# Patient Record
Sex: Female | Born: 1990 | Race: White | Hispanic: No | Marital: Single | Smoking: Never smoker
Health system: Southern US, Community
[De-identification: ages and names within clinical notes are randomized; demographics above are authoritative.]

## PROBLEM LIST (undated history)

## (undated) DIAGNOSIS — R51 Headache: Secondary | ICD-10-CM

## (undated) DIAGNOSIS — R002 Palpitations: Secondary | ICD-10-CM

## (undated) DIAGNOSIS — F419 Anxiety disorder, unspecified: Secondary | ICD-10-CM

## (undated) DIAGNOSIS — K529 Noninfective gastroenteritis and colitis, unspecified: Secondary | ICD-10-CM

## (undated) HISTORY — PX: WISDOM TOOTH EXTRACTION: SHX21

## (undated) HISTORY — PX: TONSILLECTOMY: SUR1361

## (undated) HISTORY — DX: Noninfective gastroenteritis and colitis, unspecified: K52.9

---
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
## 2010-05-09 HISTORY — PX: COLONOSCOPY: SHX174

## 2010-11-02 ENCOUNTER — Ambulatory Visit (INDEPENDENT_AMBULATORY_CARE_PROVIDER_SITE_OTHER): Payer: BC Managed Care – PPO | Admitting: Internal Medicine

## 2010-11-02 DIAGNOSIS — K5190 Ulcerative colitis, unspecified, without complications: Secondary | ICD-10-CM

## 2010-11-03 ENCOUNTER — Other Ambulatory Visit (INDEPENDENT_AMBULATORY_CARE_PROVIDER_SITE_OTHER): Payer: Self-pay | Admitting: Internal Medicine

## 2010-11-03 DIAGNOSIS — K529 Noninfective gastroenteritis and colitis, unspecified: Secondary | ICD-10-CM

## 2010-11-03 DIAGNOSIS — R1031 Right lower quadrant pain: Secondary | ICD-10-CM

## 2010-11-05 ENCOUNTER — Ambulatory Visit (HOSPITAL_COMMUNITY)
Admission: RE | Admit: 2010-11-05 | Discharge: 2010-11-05 | Disposition: A | Discharge status: Home / Self Care | Payer: BC Managed Care – PPO | Source: Ambulatory Visit | Attending: Internal Medicine | Admitting: Internal Medicine

## 2010-11-05 DIAGNOSIS — K529 Noninfective gastroenteritis and colitis, unspecified: Secondary | ICD-10-CM

## 2010-11-05 DIAGNOSIS — R1031 Right lower quadrant pain: Secondary | ICD-10-CM | POA: Insufficient documentation

## 2010-11-05 DIAGNOSIS — Z8719 Personal history of other diseases of the digestive system: Secondary | ICD-10-CM | POA: Insufficient documentation

## 2010-11-05 MED ORDER — IOHEXOL 300 MG/ML  SOLN
100.0000 mL | Freq: Once | INTRAMUSCULAR | Status: AC | PRN
  Administered 2010-11-05: 100 mL via INTRAVENOUS

## 2011-04-11 ENCOUNTER — Other Ambulatory Visit (INDEPENDENT_AMBULATORY_CARE_PROVIDER_SITE_OTHER): Payer: Self-pay | Admitting: *Deleted

## 2011-04-11 ENCOUNTER — Encounter (INDEPENDENT_AMBULATORY_CARE_PROVIDER_SITE_OTHER): Payer: Self-pay | Admitting: *Deleted

## 2011-04-11 ENCOUNTER — Ambulatory Visit (INDEPENDENT_AMBULATORY_CARE_PROVIDER_SITE_OTHER): Payer: BC Managed Care – PPO | Admitting: Internal Medicine

## 2011-04-11 ENCOUNTER — Encounter (INDEPENDENT_AMBULATORY_CARE_PROVIDER_SITE_OTHER): Payer: Self-pay | Admitting: Internal Medicine

## 2011-04-11 VITALS — BP 92/48 | HR 76 | Temp 99.1°F | Ht 65.0 in | Wt 128.1 lb

## 2011-04-11 DIAGNOSIS — K529 Noninfective gastroenteritis and colitis, unspecified: Secondary | ICD-10-CM

## 2011-04-11 DIAGNOSIS — K5289 Other specified noninfective gastroenteritis and colitis: Secondary | ICD-10-CM

## 2011-04-11 NOTE — Progress Notes (Signed)
Subjective:     Patient ID: Briana Trujillo, female   DOB: 18-Jun-1990, 20 y.o.   MRN: [id number]  HPI  Briana Trujillo is a 20 yr old white female here today for f/u.   She was last seen in out office in June of this year. She had recently been admitted to [facility] for abdominal pain.  She underwent a CT wcan which revealed mild wall thickening of the cecum with surrounding inflammatory changes. The appendix was slightly prominent and mucosa was hyper-enhancing.  Felt the findings were related to primary colitis. (Infectious or inflammatory) Repeat CT 11/05/2010 revealed an unremarkable CT abdomen/pelvis She presents today with c/o heart burn a couple of times a week.  The says she has abdominal pain which occurs about 3 times a week.  She will have it and then have to go to the bathroom to have a BM.  She is alternating between watery stools to normal stools. She will have watery stools about once a week. She usually has 2 stools a day.   No melena or bright red rectal bleeding. She says she had a stomach virus last week and lost 8 pounds. She has actually gained about 2 lbs since her last visit. She says these are the same symptoms she had when she was in the hospital but not as extreme.  Her mo her also say stress sets her symptoms off. No family hx of colon cancer or Crohn's Review of Systems  See hpi Current Outpatient Prescriptions  Medication Sig Dispense Refill  . ALPRAZolam (XANAX) 0.5 MG tablet Take 0.5 mg by mouth as needed.        Marland Kitchen amitriptyline (ELAVIL) 25 MG tablet Take 25 mg by mouth at bedtime.        . medroxyPROGESTERone (DEPO-PROVERA) 150 MG/ML injection Inject 150 mg into the muscle every 3 (three) months.        . naproxen sodium (ANAPROX) 220 MG tablet Take 220 mg by mouth as needed.         Past Surgical History  Procedure Date  . Tonisillectomy    Past Medical History  Diagnosis Date  . Colitis    History   Social History  . Marital Status: Single    Spouse Name: N/A    Number  of Children: N/A  . Years of Education: N/A   Occupational History  . Not on file.   Social History Main Topics  . Smoking status: Never Smoker   . Smokeless tobacco: Not on file  . Alcohol Use: Yes     couple of times a week  . Drug Use: No  . Sexually Active: Not on file   Other Topics Concern  . Not on file   Social History Narrative  . No narrative on file   Family Status  Relation Status Death Age  . Mother Alive     good health  . Father Alive     good health  . Brother Alive     good health   Allergies  Allergen Reactions  . Codeine         Objective:   Physical Exam Filed Vitals:   04/11/11 1053  BP: 92/48  Pulse: 76  Temp: 99.1 F (37.3 C)  Height: 5\' 5"  (1.651 m)  Weight: 128 lb 1.6 oz (58.106 kg)    Alert and oriented. Skin warm and dry. Oral mucosa is moist. Natural teeth in good condition. Sclera anicteric, conjunctivae is pink. Thyroid not enlarged. No cervical lymphadenopathy.  Lungs clear. Heart regular rate and rhythm.  Abdomen is soft. Bowel sounds are positive. No hepatomegaly. No abdominal masses felt. No tenderness.  No edema to lower extremities. Patient is alert and oriented.      Assessment:    Abdominal pain of ? Etiology.  Hx of colitis.   Inflammtory process needs to be ruled out such as Crohns.    Plan:    colonoscopy in the near future with Dr. Karilyn Cota.     The risks and benefits such as perforation, bleeding, and infection were reviewed with the patient and is agreeable.  Patient is satisified were her care.

## 2011-04-11 NOTE — Patient Instructions (Signed)
Will schedule a colonoscopy with Dr. Rehman 

## 2011-04-12 ENCOUNTER — Telehealth (INDEPENDENT_AMBULATORY_CARE_PROVIDER_SITE_OTHER): Payer: Self-pay | Admitting: *Deleted

## 2011-04-12 NOTE — Telephone Encounter (Signed)
Wasn't told when to start the clear liquid diet. Please return the call to [phone].

## 2011-04-12 NOTE — Telephone Encounter (Signed)
Spoke to patient, instructions given again

## 2011-04-13 ENCOUNTER — Encounter (HOSPITAL_COMMUNITY): Payer: Self-pay | Admitting: Pharmacy Technician

## 2011-04-13 MED ORDER — SODIUM CHLORIDE 0.45 % IV SOLN
Freq: Once | INTRAVENOUS | Status: AC
  Administered 2011-04-14: 07:00:00 via INTRAVENOUS

## 2011-04-14 ENCOUNTER — Encounter (HOSPITAL_COMMUNITY)
Admission: RE | Disposition: A | Discharge status: Home / Self Care | Payer: Self-pay | Source: Ambulatory Visit | Attending: Internal Medicine

## 2011-04-14 ENCOUNTER — Ambulatory Visit (HOSPITAL_COMMUNITY)
Admission: RE | Admit: 2011-04-14 | Discharge: 2011-04-14 | Disposition: A | Discharge status: Home / Self Care | Payer: BC Managed Care – PPO | Source: Ambulatory Visit | Attending: Internal Medicine | Admitting: Internal Medicine

## 2011-04-14 ENCOUNTER — Other Ambulatory Visit (INDEPENDENT_AMBULATORY_CARE_PROVIDER_SITE_OTHER): Payer: Self-pay | Admitting: Internal Medicine

## 2011-04-14 ENCOUNTER — Encounter (HOSPITAL_COMMUNITY): Payer: Self-pay | Admitting: *Deleted

## 2011-04-14 DIAGNOSIS — R109 Unspecified abdominal pain: Secondary | ICD-10-CM | POA: Insufficient documentation

## 2011-04-14 DIAGNOSIS — K529 Noninfective gastroenteritis and colitis, unspecified: Secondary | ICD-10-CM

## 2011-04-14 DIAGNOSIS — Z8719 Personal history of other diseases of the digestive system: Secondary | ICD-10-CM | POA: Insufficient documentation

## 2011-04-14 DIAGNOSIS — R197 Diarrhea, unspecified: Secondary | ICD-10-CM | POA: Insufficient documentation

## 2011-04-14 DIAGNOSIS — K6389 Other specified diseases of intestine: Secondary | ICD-10-CM

## 2011-04-14 HISTORY — DX: Headache: R51

## 2011-04-14 HISTORY — DX: Palpitations: R00.2

## 2011-04-14 HISTORY — DX: Anxiety disorder, unspecified: F41.9

## 2011-04-14 HISTORY — PX: COLONOSCOPY: SHX5424

## 2011-04-14 SURGERY — COLONOSCOPY
Anesthesia: Moderate Sedation

## 2011-04-14 MED ORDER — MIDAZOLAM HCL 5 MG/5ML IJ SOLN
INTRAMUSCULAR | Status: AC
  Filled 2011-04-14: qty 10

## 2011-04-14 MED ORDER — MEPERIDINE HCL 50 MG/ML IJ SOLN
INTRAMUSCULAR | Status: AC
  Filled 2011-04-14: qty 1

## 2011-04-14 MED ORDER — DICYCLOMINE HCL 10 MG PO CAPS: 10.0000 mg | ORAL_CAPSULE | Freq: Three times a day (TID) | ORAL | Status: DC

## 2011-04-14 MED ORDER — MEPERIDINE HCL 50 MG/ML IJ SOLN
INTRAMUSCULAR | Status: DC | PRN
  Administered 2011-04-14 (×2): 25 mg via INTRAVENOUS

## 2011-04-14 MED ORDER — MIDAZOLAM HCL 5 MG/5ML IJ SOLN
INTRAMUSCULAR | Status: DC | PRN
  Administered 2011-04-14 (×5): 2 mg via INTRAVENOUS

## 2011-04-14 MED ORDER — STERILE WATER FOR IRRIGATION IR SOLN
Status: DC | PRN
  Administered 2011-04-14: 08:00:00

## 2011-04-14 NOTE — Op Note (Signed)
COLONOSCOPY PROCEDURE REPORT  PATIENT:  Briana Trujillo  MR#:  [id number] Birthdate:  May 29, 1990, 20 y.o., female Endoscopist:  Dr. Malissa Hippo, MD Referred By:  Dr. Ignatius Specking, MD  Procedure Date: 04/14/2011  Procedure:   Colonoscopy with ileoscopy.  Indications: Patient is 20 year old Caucasian female with intermittent diarrhea and lower abdominal pain for the last 6 months. When her symptoms began back in June 2012 CT  revealed cecal wall thickening/inflammatory changes. Followup CT revealed resolution of these changes her symptoms have persisted. She is therefore undergoing diagnostic exam.  Informed Consent: Procedure and risks were reviewed with the patient and informed consent was obtained.  Medications:  Demerol 50 mg IV Versed 10 mg IV  Description of procedure:  After a digital rectal exam was performed, that colonoscope was advanced from the anus through the rectum and colon to the area of the cecum, ileocecal valve and appendiceal orifice. The cecum was deeply intubated. These structures were well-seen and photographed for the record. From the level of the cecum and ileocecal valve, the scope was slowly and cautiously withdrawn. The mucosal surfaces were carefully surveyed utilizing scope tip to flexion to facilitate fold flattening as needed. The scope was pulled down into the rectum where a thorough exam including retroflexion was performed. TI was also examined.  Findings:   Prep satisfactory. Mucosa of from distal most segment of terminal ileum revealed nodularity and was somewhat pale. Biopsy taken for routine histology. Fine nodularity noted to cecal mucosa possibly indicated above lymphoid hyperplasia. There were no erosions or ulcers. Biopsy was also taken from this area. Mucosa of rest of the colon was normal. Rectal mucosa similarly was normal. Unremarkable anorectal junction.  Therapeutic/Diagnostic Maneuvers Performed:  See above. Stool sample was also  obtained for cultures and O&P.  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 Biopsy taken from this area. Cecal mucosal nodularity most likely due to lymphoid hyperplasia. Once again biopsy taken from this area. No changes noted to suggest inflammatory bowel disease. Stool sample sent to the lab for cultures and O&P.  Recommendations:  Dicyclomine 10 mg by mouth 3 times a day. I will be contacting patient with results of biopsy and further recommendations  Rifka Ramey U  04/14/2011 8:14 AM  CC: Dr. Ignatius Specking., MD, MD & Dr. Bonnetta Barry ref. provider found

## 2011-04-14 NOTE — H&P (Signed)
This is an update to history and physical from 04/11/2011. There's been no change in patient's condition or medications. She is here to undergo diagnostic colonoscopy to rule out inflammatory bowel disease. Is been experiencing pain across her lower abdomen and intermittent diarrhea diarrhea. Her symptoms began in June of this year. Abdominopelvic CT revealed thickening to cecum followup CT revealed resolution of these changes and he felt she most likely had an infection. However her symptoms have lingered on.

## 2011-04-15 LAB — OVA AND PARASITE EXAMINATION: Ova and parasites: NONE SEEN

## 2011-04-18 LAB — STOOL CULTURE

## 2011-04-19 ENCOUNTER — Encounter (INDEPENDENT_AMBULATORY_CARE_PROVIDER_SITE_OTHER): Payer: Self-pay | Admitting: *Deleted

## 2011-04-27 ENCOUNTER — Encounter (HOSPITAL_COMMUNITY): Payer: Self-pay | Admitting: Internal Medicine

## 2011-07-14 ENCOUNTER — Encounter (INDEPENDENT_AMBULATORY_CARE_PROVIDER_SITE_OTHER): Payer: Self-pay | Admitting: Internal Medicine

## 2011-07-14 ENCOUNTER — Ambulatory Visit (INDEPENDENT_AMBULATORY_CARE_PROVIDER_SITE_OTHER): Payer: BC Managed Care – PPO | Admitting: Internal Medicine

## 2011-07-14 VITALS — BP 92/62 | HR 89 | Temp 97.7°F | Ht 65.0 in | Wt 129.3 lb

## 2011-07-14 DIAGNOSIS — K5289 Other specified noninfective gastroenteritis and colitis: Secondary | ICD-10-CM

## 2011-07-14 DIAGNOSIS — K529 Noninfective gastroenteritis and colitis, unspecified: Secondary | ICD-10-CM

## 2011-07-14 DIAGNOSIS — R519 Headache, unspecified: Secondary | ICD-10-CM | POA: Insufficient documentation

## 2011-07-14 NOTE — Patient Instructions (Signed)
Continue Dicyclomine. Follow up on a prn basis

## 2011-07-14 NOTE — Progress Notes (Signed)
Subjective:     Patient ID: Briana Trujillo, female   DOB: [birth year], 21 y.o.   MRN: [id number]  Briana Trujillo is a 21 yr old female here today for f/u. In June of last yr she underwent a CT scan which revealed mild wall thickening of the cecum with surrounding inflammatory changes. The appendix was slightly prominent and mucosa was hyper-enhancing. It was felt the findings were related to primary colitis (infectious or inflammatory).    Biopsy: no evidence of ileitis or microscopic/collagenous colitis.  Today she says she is doing good. The Dicyclomine is helping. She is regular and not constipated. No diarrhea. Occasionally has rt lower quadrant pain.  Appetite is good. No weight loss.  No melena or bright red rectal bleeding.     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 Biopsy taken from this area.  Cecal mucosal nodularity most likely due to lymphoid hyperplasia. Once again biopsy taken from this area.  No changes noted to suggest inflammatory bowel disease.  Stool sample sent to the lab for cultures and O&P.  Review of Systemssee hpi     Current Outpatient Prescriptions  Medication Sig Dispense Refill  . ALPRAZolam (XANAX) 0.5 MG tablet Take 0.5 mg by mouth as needed. For anxiety      . amitriptyline (ELAVIL) 25 MG tablet Take 25 mg by mouth at bedtime.        . dicyclomine (BENTYL) 10 MG capsule Take 10 mg by mouth daily.      . Lactobacillus (FLORAJEN ACIDOPHILUS PO) Take 1 capsule by mouth at bedtime.        . medroxyPROGESTERone (DEPO-PROVERA) 150 MG/ML injection Inject 150 mg into the muscle every 3 (three) months.        . Melatonin 10 MG TABS Take 1 tablet by mouth at bedtime as needed. For sleep      . naproxen sodium (ANAPROX) 220 MG tablet Take 220 mg by mouth as needed. For headaches       Past Medical History  Diagnosis Date  . Colitis   . Heart palpitations     occasional  . Headache   . Anxiety     History   Social History  . Marital Status: Single    Spouse Name: N/A    Number of Children: N/A  . Years of Education: N/A   Occupational History  . Not on file.   Social History Main Topics  . Smoking status: Never Smoker   . Smokeless tobacco: Not on file  . Alcohol Use: Yes     couple of times a week  . Drug Use: No  . Sexually Active: Not on file   Other Topics Concern  . Not on file   Social History Narrative  . No narrative on file   Family Status  Relation Status Death Age  . Mother Alive     good health  . Father Alive     good health  . Brother Alive     good health   Allergies  Allergen Reactions  . Codeine Other (See Comments)    Projectile voimitng   Past Surgical History  Procedure Date  . Tonsillectomy   . Wisdom tooth extraction   . Colonoscopy 04/14/2011    Procedure: COLONOSCOPY;  Surgeon: Malissa Hippo, MD;  Location: [facility];  Service: Endoscopy;  Laterality: N/A;  300    Objective:   Physical Exam . Filed Vitals:  07/14/11 1035  Height: 5\' 5"  (1.651 m)  Weight: 129 lb 4.8 oz (58.65 kg)  Alert and oriented. Skin warm and dry. Oral mucosa is moist.   . Sclera anicteric, conjunctivae is pink. Thyroid not enlarged. No cervical lymphadenopathy. Lungs clear. Heart regular rate and rhythm.  Abdomen is soft. Bowel sounds are positive. No hepatomegaly. No abdominal masses felt. No tenderness.  No edema to lower extremities. Patient is alert and oriented.       Assessment:    Colitis which has now resolved.  She is doing good.    Plan:    May f/u on a prn basis.

## 2011-12-05 ENCOUNTER — Other Ambulatory Visit (INDEPENDENT_AMBULATORY_CARE_PROVIDER_SITE_OTHER): Payer: Self-pay | Admitting: Internal Medicine

## 2013-02-27 ENCOUNTER — Other Ambulatory Visit (INDEPENDENT_AMBULATORY_CARE_PROVIDER_SITE_OTHER): Payer: Self-pay | Admitting: Internal Medicine

## 2013-03-05 NOTE — Telephone Encounter (Signed)
Apt has been scheduled for 05/13/13 at 10:30 am with Dr. Karilyn Cota.

## 2013-05-13 ENCOUNTER — Encounter (INDEPENDENT_AMBULATORY_CARE_PROVIDER_SITE_OTHER): Payer: Self-pay | Admitting: Internal Medicine

## 2013-05-13 ENCOUNTER — Ambulatory Visit (INDEPENDENT_AMBULATORY_CARE_PROVIDER_SITE_OTHER): Payer: BC Managed Care – PPO | Admitting: Internal Medicine

## 2013-05-13 VITALS — BP 92/66 | HR 84 | Temp 98.7°F | Resp 18 | Ht 65.0 in | Wt 130.7 lb

## 2013-05-13 DIAGNOSIS — K589 Irritable bowel syndrome without diarrhea: Secondary | ICD-10-CM | POA: Insufficient documentation

## 2013-05-13 NOTE — Progress Notes (Signed)
Presenting complaint;  Followup for IBS.  Database;  Patient is 23 year old Caucasian female who is here for scheduled visit accompanied by her mother. She was initially seen in June 2012 following brief hospitalization at [facility] for her right-sided abdominal pain and abnormal CT suggesting thickening to cecum. She had followup CT 3 weeks later on 11/05/2010 and was unremarkable. She had a colonoscopy in December 2012. She had normal t2 mucosa of terminal ileum as well as cecum felt to be secondary to lymphoid hyperplasia. Biopsy from TI and colon did not reveal inflammatory changes. She also had negative stool culture and O&P at the time of colonoscopy. She has been maintained on dicyclomine.   Subjective;  Patient has no complaints. She states dicyclomine as a medical pill for her. On most days she takes one dose and on some days she takes it twice daily. She seemed to get sleepy with double dose. On most days she has 1-2 formed stools. Once or twice a month she has abdominal cramps or diarrhea responding to second dose of dicyclomine. She has sporadic nausea but no vomiting. She denies melena or rectal bleeding. She has noted episodes of diarrhea seemed to occur when she eats greasy foods or drinks coffee. She denies nocturnal bowel movements. She has had oral aphthous ulcers. Family history is negative for IBD. She is on amitriptyline for migraine.   Current Medications: Current Outpatient Prescriptions  Medication Sig Dispense Refill  . acetaminophen (TYLENOL) 325 MG tablet Take 650 mg by mouth as needed.      . ALPRAZolam (XANAX) 0.5 MG tablet Take 0.5 mg by mouth as needed. For anxiety      . amitriptyline (ELAVIL) 25 MG tablet Take 25 mg by mouth at bedtime.        . dicyclomine (BENTYL) 10 MG capsule TAKE ONE CAPSULE THREE TIMES DAILY BEFORE MEALS  90 capsule  2  . folic acid (FOLVITE) 1 MG tablet Take 1 mg by mouth as needed.      . medroxyPROGESTERone (DEPO-PROVERA) 150 MG/ML  injection Inject 150 mg into the muscle every 3 (three) months.        . Melatonin 10 MG TABS Take 1 tablet by mouth at bedtime as needed. For sleep       No current facility-administered medications for this visit.     Objective: Blood pressure 92/66, pulse 84, temperature 98.7 F (37.1 C), temperature source Oral, resp. rate 18, height 5\' 5"  (1.651 m), weight 130 lb 11.2 oz (59.285 kg), last menstrual period 04/19/2013. Patient is alert and in no acute distress. Conjunctiva is pink. Sclera is nonicteric Oropharyngeal mucosa is normal. No neck masses or thyromegaly noted. Cardiac exam with regular rhythm normal S1 and S2. No murmur or gallop noted. Lungs are clear to auscultation. Abdomen symmetrical. Bowel sounds are normal. Abdomen is soft and nontender without organomegaly or masses.  No LE edema or clubbing noted.  Labs/studies Results:  2 abdominal pelvic CTs and one from August 2012 reviewed with the patient and her mother.  Assessment:  #1. Irritable bowel syndrome. She is doing well with therapy.    Plan:  Continue dicyclomine at present dose. Office visit in one year.

## 2013-05-13 NOTE — Patient Instructions (Signed)
Call if dicyclomine quits working

## 2013-11-04 ENCOUNTER — Other Ambulatory Visit (INDEPENDENT_AMBULATORY_CARE_PROVIDER_SITE_OTHER): Payer: Self-pay | Admitting: Internal Medicine

## 2013-11-04 NOTE — Telephone Encounter (Signed)
Per Dr.Rehman may fill with 2 additional refills. 

## 2014-03-27 ENCOUNTER — Encounter (INDEPENDENT_AMBULATORY_CARE_PROVIDER_SITE_OTHER): Payer: Self-pay | Admitting: *Deleted

## 2014-04-10 ENCOUNTER — Encounter (INDEPENDENT_AMBULATORY_CARE_PROVIDER_SITE_OTHER): Payer: Self-pay | Admitting: Internal Medicine

## 2014-04-10 ENCOUNTER — Ambulatory Visit (INDEPENDENT_AMBULATORY_CARE_PROVIDER_SITE_OTHER): Payer: BC Managed Care – PPO | Admitting: Internal Medicine

## 2014-04-10 VITALS — BP 108/52 | HR 76 | Temp 97.6°F | Ht 65.0 in | Wt 135.0 lb

## 2014-04-10 DIAGNOSIS — K589 Irritable bowel syndrome without diarrhea: Secondary | ICD-10-CM

## 2014-04-10 NOTE — Progress Notes (Signed)
Subjective:    Patient ID: Briana JuryElizabeth Trujillo, female    DOB: Mar 16, 1991, 23 y.o.   MRN: [id number]  HPI  Here today for f/u. She tells me she is doing good.  She tells me her stools are getting hard. Sometimes she is constipated. Appetite is good. No weight loss.  Left sided abdominal pain when she is constipated (stool hard).  A couple of weeks ago she saw blood and she says it was her hemorrhoids. She had to strain to have the BM.  Taking the Dicyclomine at night and sometimes she will take it during the day.     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 Biopsy taken from this area.  Cecal mucosal nodularity most likely due to lymphoid hyperplasia. Once again biopsy taken from this area.  No changes noted to suggest inflammatory bowel disease.  Stool sample sent to the lab for cultures and O&P.  Biopsy: no evidence of ileitis or microscopic/collagenous colitis  Review of Systems Single, no children. Works [employer].      Past Medical History  Diagnosis Date  . Colitis   . Heart palpitations     occasional  . Headache(784.0)   . Anxiety     Past Surgical History  Procedure Laterality Date  . Tonsillectomy    . Wisdom tooth extraction    . Colonoscopy  04/14/2011    Procedure: COLONOSCOPY;  Surgeon: Malissa HippoNajeeb U Rehman, MD;  Location: [facility];  Service: Endoscopy;  Laterality: N/A;  300  . Colonoscopy  2012    Allergies  Allergen Reactions  . Coffee Flavor Diarrhea    Patient states that she can drink 1/2 cup and go to the bathroom for 1 week after. Also experienced Nausea.  . Codeine Other (See Comments)    Projectile voimitng    Current Outpatient Prescriptions on File Prior to Visit  Medication Sig Dispense Refill  . acetaminophen (TYLENOL) 325 MG tablet Take 650 mg by mouth as needed.    . ALPRAZolam (XANAX) 0.5 MG tablet Take 0.5 mg by mouth as needed. For anxiety    .  amitriptyline (ELAVIL) 25 MG tablet Take 25 mg by mouth at bedtime.      . dicyclomine (BENTYL) 10 MG capsule TAKE ONE CAPSULE BY MOUTH THREE TIMES DAILY BEFORE MEALS 90 capsule 2  . folic acid (FOLVITE) 1 MG tablet Take 1 mg by mouth as needed.    . medroxyPROGESTERone (DEPO-PROVERA) 150 MG/ML injection Inject 150 mg into the muscle every 3 (three) months.      . Melatonin 10 MG TABS Take 1 tablet by mouth at bedtime as needed. For sleep     No current facility-administered medications on file prior to visit.        Objective:   Physical Exam  Filed Vitals:   04/10/14 0923  Height: 5\' 5"  (1.651 m)  Weight: 135 lb (61.236 kg)  Alert and oriented. Skin warm and dry. Oral mucosa is moist.   . Sclera anicteric, conjunctivae is pink. Thyroid not enlarged. No cervical lymphadenopathy. Lungs clear. Heart regular rate and rhythm.  Abdomen is soft. Bowel sounds are positive. No hepatomegaly. No abdominal masses felt. No tenderness.  No edema to lower extremities.          Assessment & Plan:  IBS: She is doing well. Occasionally has to strain to have a BM. Advised may use a stool softener. OV in 1 year.  Stool softener daily.  Continue the Dicyclomine

## 2014-04-10 NOTE — Patient Instructions (Signed)
Continue Dicyclomine., Stool softener daily.

## 2014-05-13 ENCOUNTER — Ambulatory Visit (INDEPENDENT_AMBULATORY_CARE_PROVIDER_SITE_OTHER): Payer: BC Managed Care – PPO | Admitting: Internal Medicine

## 2014-07-28 ENCOUNTER — Other Ambulatory Visit (INDEPENDENT_AMBULATORY_CARE_PROVIDER_SITE_OTHER): Payer: Self-pay | Admitting: Internal Medicine

## 2014-12-10 ENCOUNTER — Encounter (INDEPENDENT_AMBULATORY_CARE_PROVIDER_SITE_OTHER): Payer: Self-pay | Admitting: *Deleted

## 2015-04-13 ENCOUNTER — Ambulatory Visit (INDEPENDENT_AMBULATORY_CARE_PROVIDER_SITE_OTHER): Payer: 59 | Admitting: Internal Medicine

## 2015-04-13 ENCOUNTER — Encounter (INDEPENDENT_AMBULATORY_CARE_PROVIDER_SITE_OTHER): Payer: Self-pay | Admitting: Internal Medicine

## 2015-04-13 VITALS — BP 96/62 | HR 72 | Temp 98.1°F | Ht 65.0 in | Wt 133.8 lb

## 2015-04-13 DIAGNOSIS — K589 Irritable bowel syndrome without diarrhea: Secondary | ICD-10-CM | POA: Diagnosis not present

## 2015-04-13 NOTE — Patient Instructions (Addendum)
OV in 1 year. Continue Dicyclomine.

## 2015-04-13 NOTE — Progress Notes (Signed)
   Subjective:    Patient ID: Briana Trujillo, female    DOB: 17-Nov-1990, 24 y.o.   MRN: [id number]  HPI Here today for one year follow up. She was last seen in December of 2015 for IBS. Usually take Dicyclomine once at night.  She tells me she is doing good. No abdominal pain.  Appetite is good. She has gained 4 pounds since her last visit. She usually has a BM once a day. If she has an IBS flare she may have two stools.      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 Biopsy taken from this area.  Cecal mucosal nodularity most likely due to lymphoid hyperplasia. Once again biopsy taken from this area.  No changes noted to suggest inflammatory bowel disease.  Stool sample sent to the lab for cultures and O&P.  Biopsy: no evidence of ileitis or microscopic/collagenous colitis  Review of Systems Past Medical History  Diagnosis Date  . Colitis   . Heart palpitations     occasional  . Headache(784.0)   . Anxiety     Past Surgical History  Procedure Laterality Date  . Tonsillectomy    . Wisdom tooth extraction    . Colonoscopy  04/14/2011    Procedure: COLONOSCOPY;  Surgeon: Malissa HippoNajeeb U Rehman, MD;  Location: [facility];  Service: Endoscopy;  Laterality: N/A;  300  . Colonoscopy  2012    Allergies  Allergen Reactions  . Coffee Flavor Diarrhea    Patient states that she can drink 1/2 cup and go to the bathroom for 1 week after. Also experienced Nausea.  . Codeine Other (See Comments)    Projectile voimitng    Current Outpatient Prescriptions on File Prior to Visit  Medication Sig Dispense Refill  . acetaminophen (TYLENOL) 325 MG tablet Take 650 mg by mouth as needed.    . ALPRAZolam (XANAX) 0.5 MG tablet Take 0.5 mg by mouth as needed. For anxiety    . amitriptyline (ELAVIL) 25 MG tablet Take 25 mg by mouth at bedtime.      . dicyclomine (BENTYL) 10 MG capsule TAKE ONE CAPSULE BY MOUTH THREE TIMES  DAILY BEFORE MEALS. (Patient taking differently: daily) 90 capsule 5  . folic acid (FOLVITE) 1 MG tablet Take 1 mg by mouth as needed.    . Melatonin 10 MG TABS Take 1 tablet by mouth at bedtime as needed. For sleep     No current facility-administered medications on file prior to visit.        Objective:   Physical Exam Blood pressure 96/62, pulse 72, temperature 98.1 F (36.7 C), height 5\' 5"  (1.651 m), weight 133 lb 12.8 oz (60.691 kg). Alert and oriented. Skin warm and dry. Oral mucosa is moist.   . Sclera anicteric, conjunctivae is pink. Thyroid not enlarged. No cervical lymphadenopathy. Lungs clear. Heart regular rate and rhythm.  Abdomen is soft. Bowel sounds are positive. No hepatomegaly. No abdominal masses felt. No tenderness.  No edema to lower extremities.        Assessment & Plan:  IBS. She is doing well. No problems. OV in 1 year. Continue the Dicyclomine.

## 2015-10-01 ENCOUNTER — Other Ambulatory Visit (INDEPENDENT_AMBULATORY_CARE_PROVIDER_SITE_OTHER): Payer: Self-pay | Admitting: Internal Medicine

## 2016-03-11 ENCOUNTER — Encounter (INDEPENDENT_AMBULATORY_CARE_PROVIDER_SITE_OTHER): Payer: Self-pay | Admitting: Internal Medicine

## 2016-04-12 ENCOUNTER — Encounter (INDEPENDENT_AMBULATORY_CARE_PROVIDER_SITE_OTHER): Payer: Self-pay | Admitting: Internal Medicine

## 2016-04-12 ENCOUNTER — Ambulatory Visit (INDEPENDENT_AMBULATORY_CARE_PROVIDER_SITE_OTHER): Payer: BLUE CROSS/BLUE SHIELD | Admitting: Internal Medicine

## 2016-04-12 ENCOUNTER — Ambulatory Visit (INDEPENDENT_AMBULATORY_CARE_PROVIDER_SITE_OTHER): Payer: Self-pay | Admitting: Internal Medicine

## 2016-04-12 ENCOUNTER — Encounter (INDEPENDENT_AMBULATORY_CARE_PROVIDER_SITE_OTHER): Payer: Self-pay | Admitting: *Deleted

## 2016-04-12 VITALS — BP 102/62 | HR 64 | Temp 97.3°F | Ht 65.5 in | Wt 139.7 lb

## 2016-04-12 DIAGNOSIS — R1310 Dysphagia, unspecified: Secondary | ICD-10-CM | POA: Diagnosis not present

## 2016-04-12 DIAGNOSIS — K588 Other irritable bowel syndrome: Secondary | ICD-10-CM

## 2016-04-12 DIAGNOSIS — R1319 Other dysphagia: Secondary | ICD-10-CM

## 2016-04-12 NOTE — Patient Instructions (Signed)
DG. Esophagram. OV in 1 year.

## 2016-04-12 NOTE — Progress Notes (Addendum)
Subjective:    Patient ID: Briana Trujillo, female    DOB: 1990-05-20, 25 y.o.   MRN: [id number]  HPI   HPI Here today for one year follow up. She was last seen in December of 2016 for IBS. Usually take Dicyclomine one a day and sometimes 2.  She tells me she is doing good. No abdominal pain.  Appetite is good.  She has gained 6 pounds since her last visit. She says sometimes foods are slow to go down. She is drinking a lot fluids. She denies that foods are lodging.  Symptoms x 3 weeks. She rarely has acid reflux.  She takes Zantac twice a week. She points to the rt side of her upper neck where foods feel like they are slow to go down.  She usually has a BM once a day. If she has an IBS flare she may have two stools. No melena or BRRB.      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 Biopsy taken from this area.  Cecal mucosal nodularity most likely due to lymphoid hyperplasia. Once again biopsy taken from this area.  No changes noted to suggest inflammatory bowel disease.  Stool sample sent to the lab for cultures and O&P.  Biopsy: no evidence of ileitis or microscopic/collagenous colitis  Review of Systems Past Medical History:  Diagnosis Date  . Anxiety   . Colitis   . Headache(784.0)   . Heart palpitations    occasional    Past Surgical History:  Procedure Laterality Date  . COLONOSCOPY  04/14/2011   Procedure: COLONOSCOPY;  Surgeon: Malissa HippoNajeeb U Rehman, MD;  Location: [facility];  Service: Endoscopy;  Laterality: N/A;  300  . COLONOSCOPY  2012  . TONSILLECTOMY    . WISDOM TOOTH EXTRACTION      Allergies  Allergen Reactions  . Coffee Flavor Diarrhea    Patient states that she can drink 1/2 cup and go to the bathroom for 1 week after. Also experienced Nausea.  . Codeine Other (See Comments)    Projectile voimitng    Current Outpatient Prescriptions on File Prior to Visit    Medication Sig Dispense Refill  . acetaminophen (TYLENOL) 325 MG tablet Take 650 mg by mouth as needed.    . ALPRAZolam (XANAX) 0.5 MG tablet Take 0.5 mg by mouth as needed. For anxiety    . amitriptyline (ELAVIL) 25 MG tablet Take 25 mg by mouth at bedtime.      . dicyclomine (BENTYL) 10 MG capsule TAKE ONE CAPSULE BY MOUTH THREE TIMES DAILY BEFORE MEALS. (Patient taking differently: No sig reported) 90 capsule 5  . Melatonin 10 MG TABS Take 1 tablet by mouth at bedtime as needed. For sleep     No current facility-administered medications on file prior to visit.        Objective:   Physical Exam Blood pressure 102/62, pulse 64, temperature 97.3 F (36.3 C), height 5' 5.5" (1.664 m), weight 139 lb 11.2 oz (63.4 kg).  Alert and oriented. Skin warm and dry. Oral mucosa is moist.   . Sclera anicteric, conjunctivae is pink. Thyroid not enlarged. No cervical lymphadenopathy. Lungs clear. Heart regular rate and rhythm.  Abdomen is soft. Bowel sounds are positive. No hepatomegaly. No abdominal masses felt. No tenderness.  No edema to lower extremities. .       Assessment & Plan:  IBS. She is doing well. Continue the Dicyclomine. Dysphagia: am going  to get an DG esophagram.  Further recommendations to follow.

## 2016-04-18 ENCOUNTER — Encounter (HOSPITAL_COMMUNITY): Payer: Self-pay | Admitting: Radiology

## 2016-04-18 ENCOUNTER — Ambulatory Visit (HOSPITAL_COMMUNITY)
Admission: RE | Admit: 2016-04-18 | Discharge: 2016-04-18 | Disposition: A | Discharge status: Home / Self Care | Payer: BLUE CROSS/BLUE SHIELD | Source: Ambulatory Visit | Attending: Internal Medicine | Admitting: Internal Medicine

## 2016-04-18 DIAGNOSIS — R1310 Dysphagia, unspecified: Secondary | ICD-10-CM | POA: Diagnosis present

## 2016-04-18 DIAGNOSIS — K224 Dyskinesia of esophagus: Secondary | ICD-10-CM | POA: Insufficient documentation

## 2016-04-18 DIAGNOSIS — K219 Gastro-esophageal reflux disease without esophagitis: Secondary | ICD-10-CM | POA: Insufficient documentation

## 2016-04-18 DIAGNOSIS — R1319 Other dysphagia: Secondary | ICD-10-CM

## 2016-04-20 ENCOUNTER — Encounter (INDEPENDENT_AMBULATORY_CARE_PROVIDER_SITE_OTHER): Payer: Self-pay | Admitting: Internal Medicine

## 2016-04-20 NOTE — Progress Notes (Signed)
Patient was given an appointment for 07/19/16 at 10:45am with Dorene Arerri Setzer, NP.  A letter was mailed to the patient.

## 2016-07-19 ENCOUNTER — Ambulatory Visit (INDEPENDENT_AMBULATORY_CARE_PROVIDER_SITE_OTHER): Payer: BLUE CROSS/BLUE SHIELD | Admitting: Internal Medicine

## 2016-11-11 ENCOUNTER — Other Ambulatory Visit (INDEPENDENT_AMBULATORY_CARE_PROVIDER_SITE_OTHER): Payer: Self-pay | Admitting: Internal Medicine

## 2016-11-24 ENCOUNTER — Ambulatory Visit (INDEPENDENT_AMBULATORY_CARE_PROVIDER_SITE_OTHER): Payer: BLUE CROSS/BLUE SHIELD | Admitting: Internal Medicine

## 2016-11-24 ENCOUNTER — Encounter (INDEPENDENT_AMBULATORY_CARE_PROVIDER_SITE_OTHER): Payer: Self-pay | Admitting: Internal Medicine

## 2016-11-24 VITALS — BP 110/60 | HR 72 | Temp 97.8°F | Ht 65.0 in | Wt 144.7 lb

## 2016-11-24 DIAGNOSIS — K589 Irritable bowel syndrome without diarrhea: Secondary | ICD-10-CM | POA: Diagnosis not present

## 2016-11-24 MED ORDER — HYOSCYAMINE SULFATE 0.125 MG SL SUBL: 0.1250 mg | SUBLINGUAL_TABLET | SUBLINGUAL | 0 refills | Status: DC | PRN

## 2016-11-24 NOTE — Patient Instructions (Addendum)
Dicyclomine 10mg  daily.  Rx for Levsin to her pharmacy.

## 2016-11-24 NOTE — Progress Notes (Signed)
   Subjective:    Patient ID: Briana Trujillo, female    DOB: [birth year], 26 y.o.   MRN: [id number]  HPI Last weight in December 2017 was 139.  Hx of IBS and is maintained on dicyclomine. She says her IBS is worse.  Has been taking the Dicyclomine BID at this point. She has been under stress. She is getting married next year. Appetite has been okay. She has lost weight. She has some nausea when she has the abdominal cramping.  Weight in December was 139. 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 Biopsy taken from this area.  Cecal mucosal nodularity most likely due to lymphoid hyperplasia. Once again biopsy taken from this area.  No changes noted to suggest inflammatory bowel disease.  Stool sample sent to the lab for cultures and O&P. Sometimes she has a BM x 3 a day.   Biopsy: no evidence of ileitis or microscopic/collagenous colitis  Review of Systems Past Medical History:  Diagnosis Date  . Anxiety   . Colitis   . Headache(784.0)   . Heart palpitations    occasional    Past Surgical History:  Procedure Laterality Date  . COLONOSCOPY  04/14/2011   Procedure: COLONOSCOPY;  Surgeon: Malissa HippoNajeeb U Rehman, MD;  Location: [facility];  Service: Endoscopy;  Laterality: N/A;  300  . COLONOSCOPY  2012  . TONSILLECTOMY    . WISDOM TOOTH EXTRACTION      Allergies  Allergen Reactions  . Coffee Flavor Diarrhea    Patient states that she can drink 1/2 cup and go to the bathroom for 1 week after. Also experienced Nausea.  . Codeine Other (See Comments)    Projectile voimitng    Current Outpatient Prescriptions on File Prior to Visit  Medication Sig Dispense Refill  . acetaminophen (TYLENOL) 325 MG tablet Take 650 mg by mouth as needed.    . ALPRAZolam (XANAX) 0.5 MG tablet Take 0.25 mg by mouth as needed. For anxiety    . amitriptyline (ELAVIL) 25 MG tablet Take 25 mg by  mouth at bedtime.      . dicyclomine (BENTYL) 10 MG capsule TAKE ONE CAPSULE BY MOUTH THREE TIMES DAILY BEFORE MEALS. (Patient taking differently: take BID) 90 capsule 1   No current facility-administered medications on file prior to visit.         Objective:   Physical Exam Blood pressure 110/60, pulse 72, temperature 97.8 F (36.6 C), height 5\' 5"  (1.651 m), weight 144 lb 11.2 oz (65.6 kg). Alert and oriented. Skin warm and dry. Oral mucosa is moist.   . Sclera anicteric, conjunctivae is pink. Thyroid not enlarged. No cervical lymphadenopathy. Lungs clear. Heart regular rate and rhythm.  Abdomen is soft. Bowel sounds are positive. No hepatomegaly. No abdominal masses felt. No tenderness.  No edema to lower extremities.            Assessment & Plan:  Probable IBS. Take the Dicyclomine 10mg  daily. Levsin sl TID as needed. PR in 2 weeks.

## 2017-03-27 ENCOUNTER — Encounter (INDEPENDENT_AMBULATORY_CARE_PROVIDER_SITE_OTHER): Payer: Self-pay | Admitting: Internal Medicine

## 2017-04-12 ENCOUNTER — Ambulatory Visit (INDEPENDENT_AMBULATORY_CARE_PROVIDER_SITE_OTHER): Payer: BLUE CROSS/BLUE SHIELD | Admitting: Internal Medicine

## 2017-04-18 ENCOUNTER — Other Ambulatory Visit (INDEPENDENT_AMBULATORY_CARE_PROVIDER_SITE_OTHER): Payer: Self-pay | Admitting: Internal Medicine

## 2017-07-07 ENCOUNTER — Encounter (HOSPITAL_COMMUNITY): Payer: Self-pay

## 2017-07-07 ENCOUNTER — Emergency Department (HOSPITAL_COMMUNITY)
Admission: EM | Admit: 2017-07-07 | Discharge: 2017-07-08 | Disposition: A | Discharge status: Home / Self Care | Payer: BLUE CROSS/BLUE SHIELD | Attending: Emergency Medicine | Admitting: Emergency Medicine

## 2017-07-07 ENCOUNTER — Other Ambulatory Visit: Payer: Self-pay

## 2017-07-07 ENCOUNTER — Emergency Department (HOSPITAL_COMMUNITY): Payer: BLUE CROSS/BLUE SHIELD

## 2017-07-07 DIAGNOSIS — Z79899 Other long term (current) drug therapy: Secondary | ICD-10-CM | POA: Insufficient documentation

## 2017-07-07 DIAGNOSIS — R002 Palpitations: Secondary | ICD-10-CM | POA: Insufficient documentation

## 2017-07-07 DIAGNOSIS — R109 Unspecified abdominal pain: Secondary | ICD-10-CM | POA: Diagnosis not present

## 2017-07-07 DIAGNOSIS — N921 Excessive and frequent menstruation with irregular cycle: Secondary | ICD-10-CM | POA: Diagnosis not present

## 2017-07-07 LAB — URINALYSIS, ROUTINE W REFLEX MICROSCOPIC
Bilirubin Urine: NEGATIVE
GLUCOSE, UA: NEGATIVE mg/dL
Ketones, ur: NEGATIVE mg/dL
Leukocytes, UA: NEGATIVE
NITRITE: NEGATIVE
Protein, ur: NEGATIVE mg/dL
Specific Gravity, Urine: 1.005 (ref 1.005–1.030)
Squamous Epithelial / LPF: NONE SEEN
pH: 7 (ref 5.0–8.0)

## 2017-07-07 LAB — BASIC METABOLIC PANEL
ANION GAP: 9 (ref 5–15)
BUN: 8 mg/dL (ref 6–20)
CHLORIDE: 104 mmol/L (ref 101–111)
CO2: 23 mmol/L (ref 22–32)
Calcium: 9.1 mg/dL (ref 8.9–10.3)
Creatinine, Ser: 0.84 mg/dL (ref 0.44–1.00)
GFR calc Af Amer: >60 mL/min (ref >60)
GLUCOSE: 90 mg/dL (ref 65–99)
POTASSIUM: 3.6 mmol/L (ref 3.5–5.1)
Sodium: 136 mmol/L (ref 135–145)

## 2017-07-07 LAB — I-STAT BETA HCG BLOOD, ED (MC, WL, AP ONLY): I-stat hCG, quantitative: <5 m[IU]/mL (ref <5)

## 2017-07-07 LAB — I-STAT TROPONIN, ED: Troponin i, poc: 0 ng/mL (ref 0.00–0.08)

## 2017-07-07 LAB — CBC
HEMATOCRIT: 41.3 % (ref 36.0–46.0)
HEMOGLOBIN: 13.8 g/dL (ref 12.0–15.0)
MCH: 30.4 pg (ref 26.0–34.0)
MCHC: 33.4 g/dL (ref 30.0–36.0)
MCV: 91 fL (ref 78.0–100.0)
Platelets: 297 10*3/uL (ref 150–400)
RBC: 4.54 MIL/uL (ref 3.87–5.11)
RDW: 12.8 % (ref 11.5–15.5)
WBC: 9.8 10*3/uL (ref 4.0–10.5)

## 2017-07-07 NOTE — ED Triage Notes (Signed)
Pt reports being on her period which is heavy and causing abd pain. Pt reports palpations at dinner and she believes she is dehydrated.

## 2017-07-07 NOTE — ED Notes (Signed)
Pt states she is leaving because she is feeling better.  Spoke with Sharilyn SitesLisa Sanders, PA about possibly seeing pt in [facility].  Encouraged pt to stay and she agreed to wait a little longer.

## 2017-07-08 MED ORDER — KETOROLAC TROMETHAMINE 30 MG/ML IJ SOLN
30.0000 mg | Freq: Once | INTRAMUSCULAR | Status: AC
  Administered 2017-07-08: 30 mg via INTRAVENOUS
  Filled 2017-07-08: qty 1

## 2017-07-08 MED ORDER — SODIUM CHLORIDE 0.9 % IV BOLUS (SEPSIS)
1000.0000 mL | Freq: Once | INTRAVENOUS | Status: AC
  Administered 2017-07-08: 1000 mL via INTRAVENOUS

## 2017-07-08 NOTE — ED Notes (Signed)
Patient ambulated to restroom with steady gait. Denies pain, dizziness, lightheadedness.

## 2017-07-08 NOTE — ED Provider Notes (Signed)
[facility] Provider Note   CSN: [id number] Arrival date & time: 07/07/17  2003     History   Chief Complaint Chief Complaint  Patient presents with  . Palpations    HPI Briana Trujillo is a 27 y.o. female.  HPI Patient is a 27 year old female with a history of VSD who presents to the emergency department with crampy abdominal pain and heavier than normal vaginal bleeding.  She is sexually active.  She states that this evening she developed acute palpitations feeling like an abnormal racing heart rate.  She states she has this several times a month and has.  She reports she has annual echocardiograms which demonstrate no significant abnormality nor change in her VSD.  She has not seen a cardiologist.  No prior diagnosis of atrial fibrillation or other tachyarrhythmias.  No syncope or significant lightheadedness associated with the palpitations tonight.  Asymptomatic at this time in regards to palpitations but does have some crampy lower abdominal pain without dysuria or urinary frequency.    Past Medical History:  Diagnosis Date  . Anxiety   . Colitis   . Headache(784.0)   . Heart palpitations    occasional    Patient Active Problem List   Diagnosis Date Noted  . IBS (irritable bowel syndrome) 05/13/2013  . Chronic headaches 07/14/2011    Past Surgical History:  Procedure Laterality Date  . COLONOSCOPY  04/14/2011   Procedure: COLONOSCOPY;  Surgeon: Malissa HippoNajeeb U Rehman, MD;  Location: [facility];  Service: Endoscopy;  Laterality: N/A;  300  . COLONOSCOPY  2012  . TONSILLECTOMY    . WISDOM TOOTH EXTRACTION      OB History    No data available       Home Medications    Prior to Admission medications   Medication Sig Start Date End Date Taking? Authorizing Provider  acetaminophen (TYLENOL) 325 MG tablet Take 650 mg by mouth as needed.    [provider]  ALPRAZolam Prudy Feeler(XANAX) 0.5 MG tablet Take 0.25 mg by mouth as  needed. For anxiety    [provider]  amitriptyline (ELAVIL) 25 MG tablet Take 25 mg by mouth at bedtime.      [provider]  dicyclomine (BENTYL) 10 MG capsule TAKE ONE CAPSULE BY MOUTH THREE TIMES DAILY BEFORE MEALS. 04/19/17   Setzer, Brand Maleserri L, NP  hyoscyamine (LEVSIN SL) 0.125 MG SL tablet Place 1 tablet (0.125 mg total) under the tongue every 4 (four) hours as needed. 11/24/16   Setzer, Brand Maleserri L, NP  lactobacillus acidophilus (BACID) TABS tablet Take 2 tablets by mouth 3 (three) times daily.    [provider]  loratadine (CLARITIN) 10 MG tablet Take 10 mg by mouth daily.    [provider]    Family History Family History  Problem Relation Age of Onset  . Colon cancer Neg Hx     Social History Social History   Tobacco Use  . Smoking status: Never Smoker  . Smokeless tobacco: Never Used  Substance Use Topics  . Alcohol use: Yes    Comment: once  a week per patient  . Drug use: No     Allergies   Coffee flavor and Codeine   Review of Systems Review of Systems  All other systems reviewed and are negative.    Physical Exam Updated Vital Signs BP 125/73   Pulse 78   Temp 97.8 F (36.6 C) (Oral)   Resp 19   Ht 5'  5" (1.651 m)   Wt 65.8 kg (145 lb)   LMP 07/07/2017   SpO2 100%   BMI 24.13 kg/m   Physical Exam  Constitutional: She is oriented to person, place, and time. She appears well-developed and well-nourished. No distress.  HENT:  Head: Normocephalic and atraumatic.  Eyes: EOM are normal.  Neck: Normal range of motion.  Cardiovascular: Normal rate, regular rhythm and normal heart sounds.  Pulmonary/Chest: Effort normal and breath sounds normal.  Abdominal: Soft. She exhibits no distension. There is no tenderness.  Musculoskeletal: Normal range of motion.  Neurological: She is alert and oriented to person, place, and time.  Skin: Skin is warm and dry.  Psychiatric: She has a normal mood and affect. Judgment normal.   Nursing note and vitals reviewed.    ED Treatments / Results  Labs (all labs ordered are listed, but only abnormal results are displayed) Labs Reviewed  URINALYSIS, ROUTINE W REFLEX MICROSCOPIC - Abnormal; Notable for the following components:      Result Value   Color, Urine STRAW (*)    Hgb urine dipstick LARGE (*)    Bacteria, UA RARE (*)    All other components within normal limits  BASIC METABOLIC PANEL  CBC  I-STAT TROPONIN, ED  I-STAT BETA HCG BLOOD, ED ([facility], [facility], [facility] ONLY)    EKG  EKG Interpretation  Date/Time:  Friday July 07 2017 20:10:49 EST Ventricular Rate:  80 PR Interval:  152 QRS Duration: 88 QT Interval:  358 QTC Calculation: 412 R Axis:   -29 Text Interpretation:  Normal sinus rhythm Normal ECG No old tracing to compare Confirmed by Azalia Bilis ([id number]) on 07/07/2017 11:54:48 PM       Radiology Dg Chest 2 View  Result Date: 07/07/2017 CLINICAL DATA:  Palpitations. EXAM: CHEST  2 VIEW COMPARISON:  Radiographs of February 28, 2017. FINDINGS: The heart size and mediastinal contours are within normal limits. Both lungs are clear. No pneumothorax or pleural effusion is noted. The visualized skeletal structures are unremarkable. IMPRESSION: No active cardiopulmonary disease. Electronically Signed   By: Lupita Raider, M.D.   On: 07/07/2017 20:49    Procedures Procedures (including critical care time)  Medications Ordered in ED Medications  sodium chloride 0.9 % bolus 1,000 mL (0 mLs Intravenous Stopped 07/08/17 0137)  ketorolac (TORADOL) 30 MG/ML injection 30 mg (30 mg Intravenous Given 07/08/17 0054)     Initial Impression / Assessment and Plan / ED Course  I have reviewed the triage vital signs and the nursing notes.  Pertinent labs & imaging results that were available during my care of the patient were reviewed by me and considered in my medical decision making (see chart for details).     Hemoglobin stable.  Vital signs stable.  EKG without  abnormality.  No ectopy noted on the monitor.  Home with cardiology follow-up as she may benefit from Holter monitor.  Primary care and GYN follow-up in regards to her heavy menses.  Vital signs are stable and hemoglobin is stable today.  Ambulatory in the ER  Final Clinical Impressions(s) / ED Diagnoses   Final diagnoses:  Palpitations  Acute abdominal pain  Menorrhagia with irregular cycle    ED Discharge Orders    None       Azalia Bilis, MD 07/08/17 [phone]

## 2017-09-25 ENCOUNTER — Other Ambulatory Visit (INDEPENDENT_AMBULATORY_CARE_PROVIDER_SITE_OTHER): Payer: Self-pay | Admitting: Internal Medicine

## 2017-12-11 ENCOUNTER — Other Ambulatory Visit (INDEPENDENT_AMBULATORY_CARE_PROVIDER_SITE_OTHER): Payer: Self-pay | Admitting: Internal Medicine

## 2018-04-17 ENCOUNTER — Ambulatory Visit (INDEPENDENT_AMBULATORY_CARE_PROVIDER_SITE_OTHER): Payer: BLUE CROSS/BLUE SHIELD | Admitting: Internal Medicine

## 2018-04-17 ENCOUNTER — Encounter (INDEPENDENT_AMBULATORY_CARE_PROVIDER_SITE_OTHER): Payer: Self-pay | Admitting: Internal Medicine

## 2018-04-17 VITALS — BP 130/72 | HR 84 | Temp 98.1°F | Ht 64.0 in | Wt 154.9 lb

## 2018-04-17 DIAGNOSIS — K580 Irritable bowel syndrome with diarrhea: Secondary | ICD-10-CM | POA: Diagnosis not present

## 2018-04-17 NOTE — Progress Notes (Signed)
Subjective:    Patient ID: Briana Trujillo, female    DOB: 05-15-1990, 27 y.o.   MRN: [id number]  HPI Here today for f/u. Last seen in July of 2018. Hx of IBS. She has married this past August. She is doing well. She says she has changed jobs and is getting ready next year to [employer]. 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 She tells me she has had some constipation which she thinks was related to the cold medication she was taken. She has had some diarrhea which is related to her stress.  Her appetite is good for the most part. She has gained 10 pounds since her last visit. She is however on [facility] now. Recently married in August  She denies any abdominal pain.                04/14/11 colonoscopy:  Intermittent diarrhea and lower abdominal pain x 6 months. 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 Biopsy taken from this area.  Cecal mucosal nodularity most likely due to lymphoid hyperplasia. Once again biopsy taken from this area.  No changes noted to suggest inflammatory bowel disease.  Stool sample sent to the lab for cultures and O&P. Biopsy: no evidence of ileitis or microscopic/collagenous colitis  Review of Systems Past Medical History:  Diagnosis Date  . Anxiety   . Colitis   . Headache(784.0)   . Heart palpitations    occasional    Past Surgical History:  Procedure Laterality Date  . COLONOSCOPY  04/14/2011   Procedure: COLONOSCOPY;  Surgeon: Malissa Hippo, MD;  Location: [facility];  Service: Endoscopy;  Laterality: N/A;  300  . COLONOSCOPY  2012  . TONSILLECTOMY    . WISDOM TOOTH EXTRACTION      Allergies  Allergen Reactions  . Coffee Flavor Diarrhea    Patient states that she can drink 1/2 cup and go to the bathroom for 1 week after. Also experienced Nausea.  . Codeine Other (See Comments)    Projectile voimitng    Current Outpatient Medications on File Prior to Visit  Medication Sig Dispense Refill  . acetaminophen (TYLENOL)  325 MG tablet Take 650 mg by mouth as needed.    . ALPRAZolam (XANAX) 0.5 MG tablet Take 0.25 mg by mouth as needed. For anxiety    . dicyclomine (BENTYL) 10 MG capsule TAKE ONE CAPSULE BY MOUTH THREE TIMES DAILY BEFORE MEALS. (Patient taking differently: 2 (two) times daily after a meal. ) 90 capsule 1  . ibuprofen (ADVIL,MOTRIN) 200 MG tablet Take 200 mg by mouth every 6 (six) hours as needed.    . loratadine (CLARITIN) 10 MG tablet Take 10 mg by mouth daily.    . norethindrone-ethinyl estradiol (CYCLAFEM,ALYACEN) 0.5/0.75/1-35 MG-MCG tablet Take 1 tablet by mouth daily.    . propranolol (INDERAL) 10 MG tablet Take 10 mg by mouth daily.     No current facility-administered medications on file prior to visit.         Objective:   Physical Exam Blood pressure 130/72, pulse 84, temperature 98.1 F (36.7 C), height 5\' 4"  (1.626 m), weight 154 lb 14.4 oz (70.3 kg). Alert and oriented. Skin warm and dry. Oral mucosa is moist.   . Sclera anicteric, conjunctivae is pink. Thyroid not enlarged. No cervical lymphadenopathy. Lungs clear. Heart regular rate and rhythm.  Abdomen is soft. Bowel sounds are positive. No hepatomegaly. No abdominal masses felt. No tenderness.  No edema to lower extremities.  Assessment & Plan:  IBS. She is doing well. She has no GI complaints. She will have OV in 1 year.

## 2018-04-17 NOTE — Patient Instructions (Signed)
Continue the Dicyclomine. OV in 1 year. 

## 2018-09-11 ENCOUNTER — Telehealth (INDEPENDENT_AMBULATORY_CARE_PROVIDER_SITE_OTHER): Payer: Self-pay | Admitting: Internal Medicine

## 2018-09-11 DIAGNOSIS — Z8719 Personal history of other diseases of the digestive system: Secondary | ICD-10-CM

## 2018-09-11 MED ORDER — DICYCLOMINE HCL 10 MG PO CAPS: ORAL_CAPSULE | ORAL | 3 refills | Status: AC

## 2018-09-11 NOTE — Telephone Encounter (Signed)
Patient left message stating the pharmacy had sent a refill request last Thursday - stated she would be running out this Friday.  Please call her at [phone]

## 2018-09-11 NOTE — Telephone Encounter (Signed)
Rx for dicyclomine sent to her pharmacy 

## 2018-09-11 NOTE — Telephone Encounter (Signed)
I have not received a request from Briana Trujillo's drugs. I did go ahead and send an Rx in to her pharmacy.  I have spoken with patient.

## 2018-10-01 IMAGING — CR DG CHEST 2V
2 series · 2 of 2 positions shown · non-contrast
Comparison: Radiographs February 28, 2017.

CLINICAL DATA: Palpitations.

EXAM:
CHEST  2 VIEW

[chest pa]
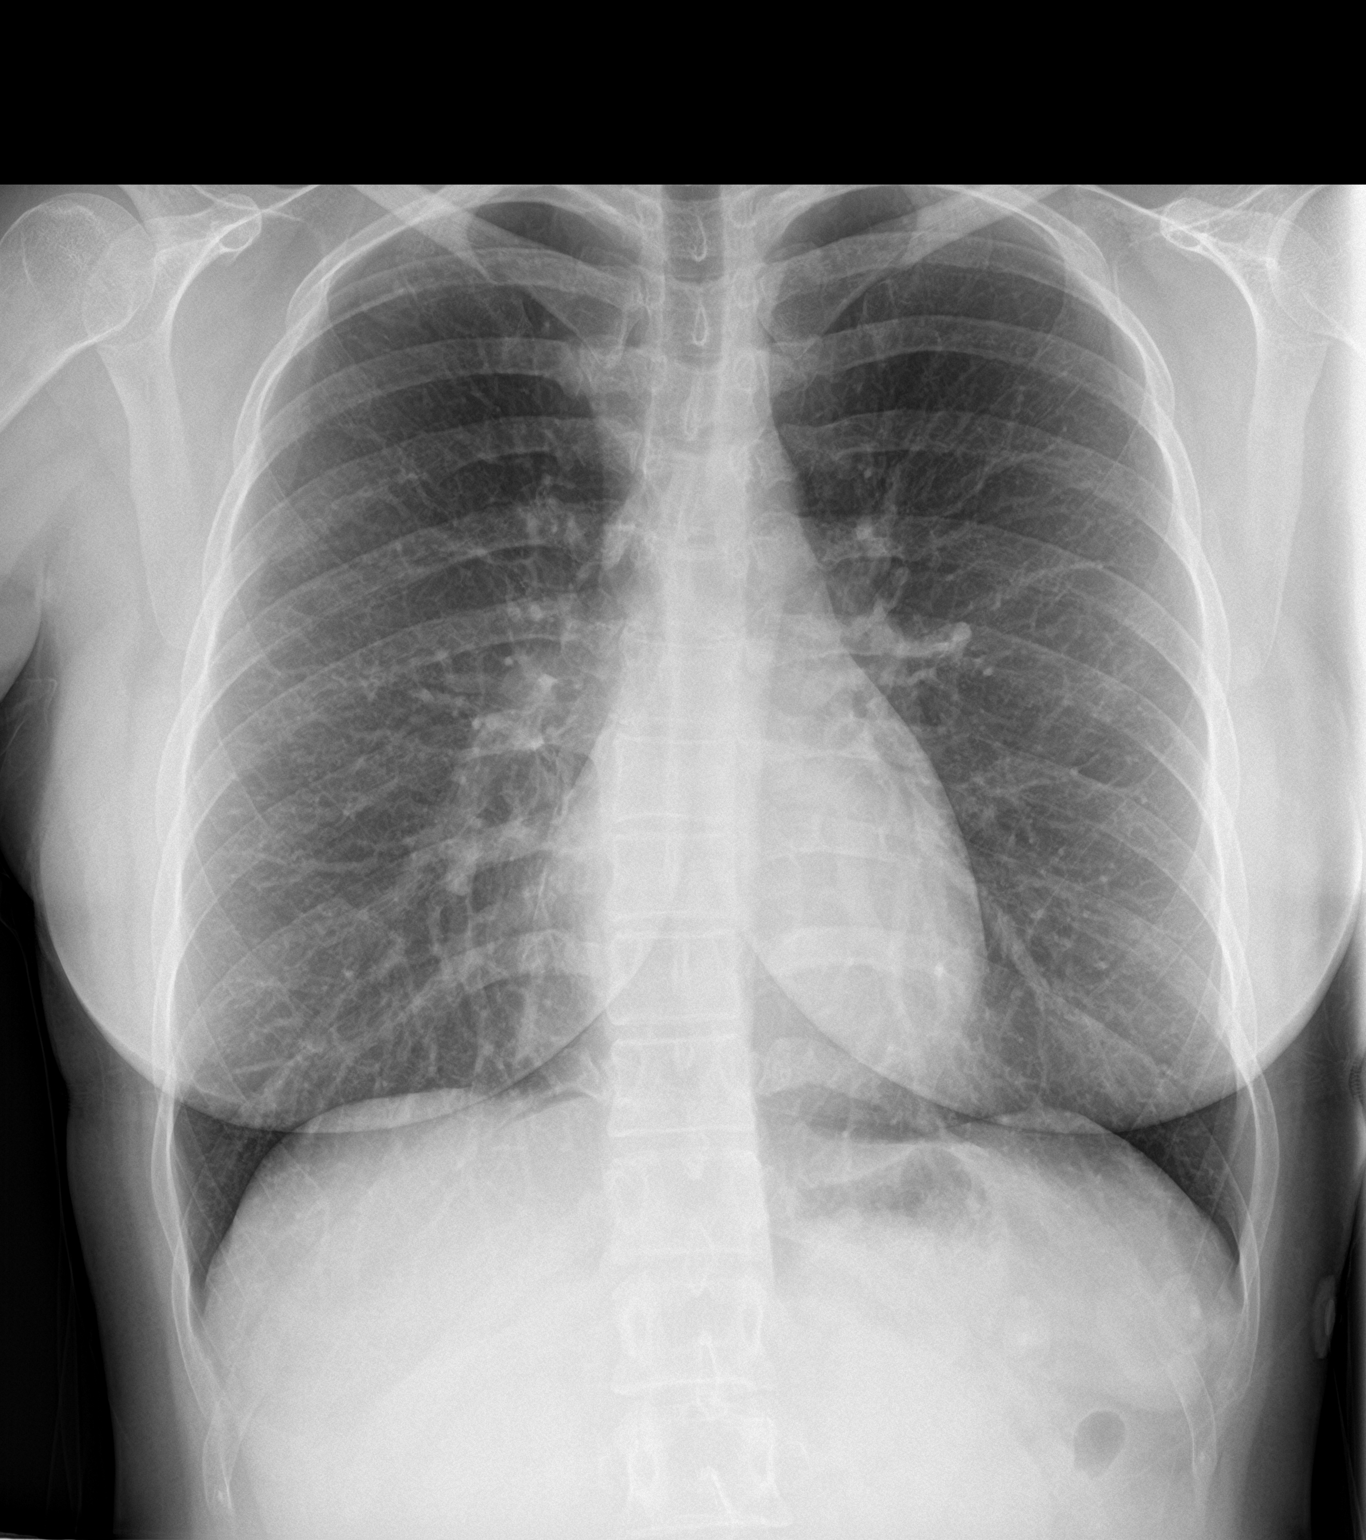

[chest lat]
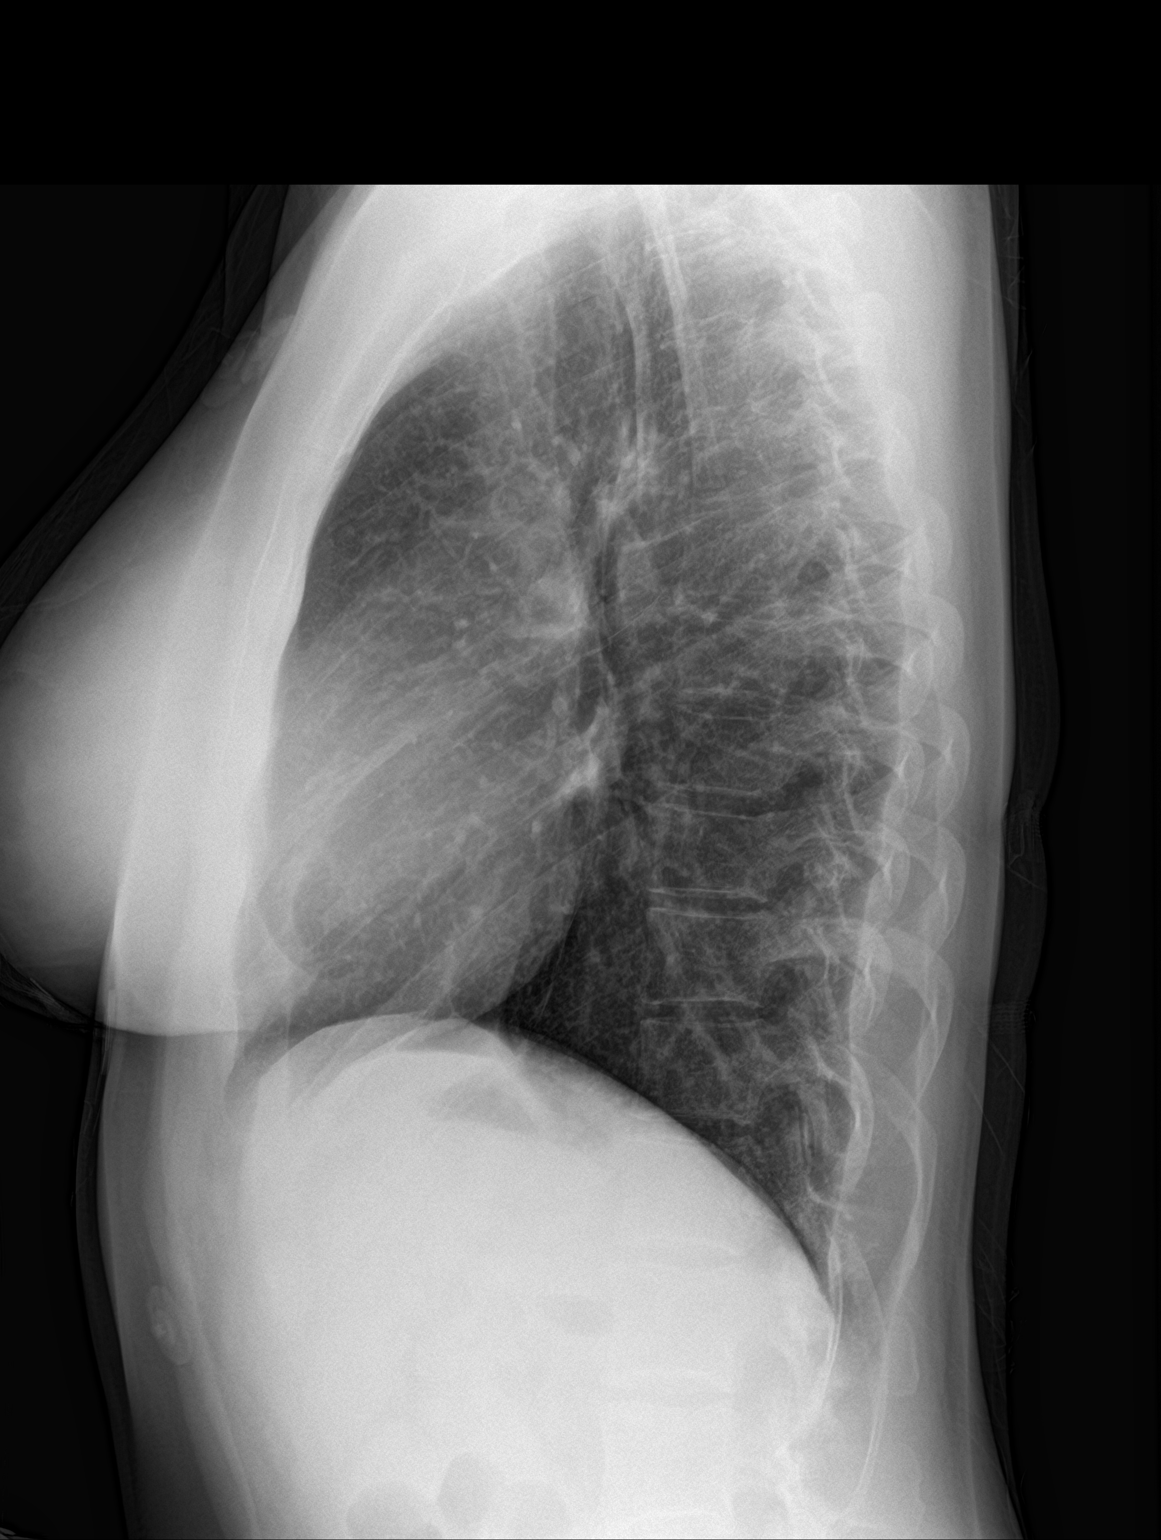

[2 of 2 positions shown; findings below may reference images not displayed]

FINDINGS: The heart size and mediastinal contours are within normal limits.
Both lungs are clear. No pneumothorax or pleural effusion is noted.
The visualized skeletal structures are unremarkable.
IMPRESSION: No active cardiopulmonary disease.

## 2019-04-18 ENCOUNTER — Ambulatory Visit (INDEPENDENT_AMBULATORY_CARE_PROVIDER_SITE_OTHER): Payer: BLUE CROSS/BLUE SHIELD | Admitting: Nurse Practitioner
